# Patient Record
Sex: Male | Born: 1980 | Race: White | Hispanic: No | Smoking: Never smoker
Health system: Southern US, Community
[De-identification: ages and names within clinical notes are randomized; demographics above are authoritative.]

## PROBLEM LIST (undated history)

## (undated) DIAGNOSIS — M5126 Other intervertebral disc displacement, lumbar region: Secondary | ICD-10-CM

## (undated) DIAGNOSIS — M51369 Other intervertebral disc degeneration, lumbar region without mention of lumbar back pain or lower extremity pain: Secondary | ICD-10-CM

## (undated) DIAGNOSIS — E232 Diabetes insipidus: Secondary | ICD-10-CM

## (undated) DIAGNOSIS — M5136 Other intervertebral disc degeneration, lumbar region: Secondary | ICD-10-CM

## (undated) DIAGNOSIS — K5190 Ulcerative colitis, unspecified, without complications: Secondary | ICD-10-CM

## (undated) HISTORY — DX: Diabetes insipidus: E23.2

## (undated) HISTORY — DX: Ulcerative colitis, unspecified, without complications: K51.90

## (undated) HISTORY — DX: Other intervertebral disc displacement, lumbar region: M51.26

## (undated) HISTORY — DX: Other intervertebral disc degeneration, lumbar region without mention of lumbar back pain or lower extremity pain: M51.369

## (undated) HISTORY — PX: NO PAST SURGERIES: SHX2092

## (undated) HISTORY — DX: Other intervertebral disc degeneration, lumbar region: M51.36

---

## 2005-08-09 DIAGNOSIS — E232 Diabetes insipidus: Secondary | ICD-10-CM

## 2005-08-09 HISTORY — DX: Diabetes insipidus: E23.2

## 2005-10-12 ENCOUNTER — Encounter: Admission: RE | Admit: 2005-10-12 | Discharge: 2005-10-12 | Payer: Self-pay | Admitting: *Deleted

## 2006-01-27 ENCOUNTER — Encounter: Admission: RE | Admit: 2006-01-27 | Discharge: 2006-01-27 | Payer: Self-pay | Admitting: Neurology

## 2006-05-25 ENCOUNTER — Ambulatory Visit: Payer: Self-pay | Admitting: Physical Medicine & Rehabilitation

## 2006-05-25 ENCOUNTER — Encounter
Admission: RE | Admit: 2006-05-25 | Discharge: 2006-08-23 | Payer: Self-pay | Admitting: Physical Medicine & Rehabilitation

## 2006-07-22 ENCOUNTER — Ambulatory Visit: Payer: Self-pay | Admitting: Physical Medicine & Rehabilitation

## 2006-09-20 ENCOUNTER — Encounter
Admission: RE | Admit: 2006-09-20 | Discharge: 2006-12-19 | Payer: Self-pay | Admitting: Physical Medicine & Rehabilitation

## 2006-10-13 ENCOUNTER — Ambulatory Visit: Payer: Self-pay | Admitting: Physical Medicine & Rehabilitation

## 2007-02-03 IMAGING — RF DG FL GUIDE NDL PLMT  - NRPT [facility]
2 series · 2 of 2 positions shown · IV contrast (omniscan)
Comparison: none

CLINICAL DATA: Wrist pain. No previous surgery.

Right wrist injection under fluoroscopy for MRI:
TECHNIQUE: Under fluoroscopy, an appropriate skin entry site was identified.
Skin site was marked, prepped with Betadine, and draped in usual sterile
fashion, and infiltrated locally with 1% lidocaine.
A 25-gauge short needle was advanced into the radiocarpal compartment at the
level of the scaphoid. A mixture of 10 mL iodinated contrast with 0.1 mL of
Omniscan was utilized, administering approximately 3 cc into the radiocarpal
compartment under fluoroscopic visualization. Spot fluoroscopic images
demonstrate appropriate intra-articular contrast spread. Patient was transferred
to MRI for the scheduled scan. Patient tolerated the procedure well, with no
immediate complication.

[Series 1: arthrogram · 1 of 1 slices shown (1 of 2)]
[im 1/1]
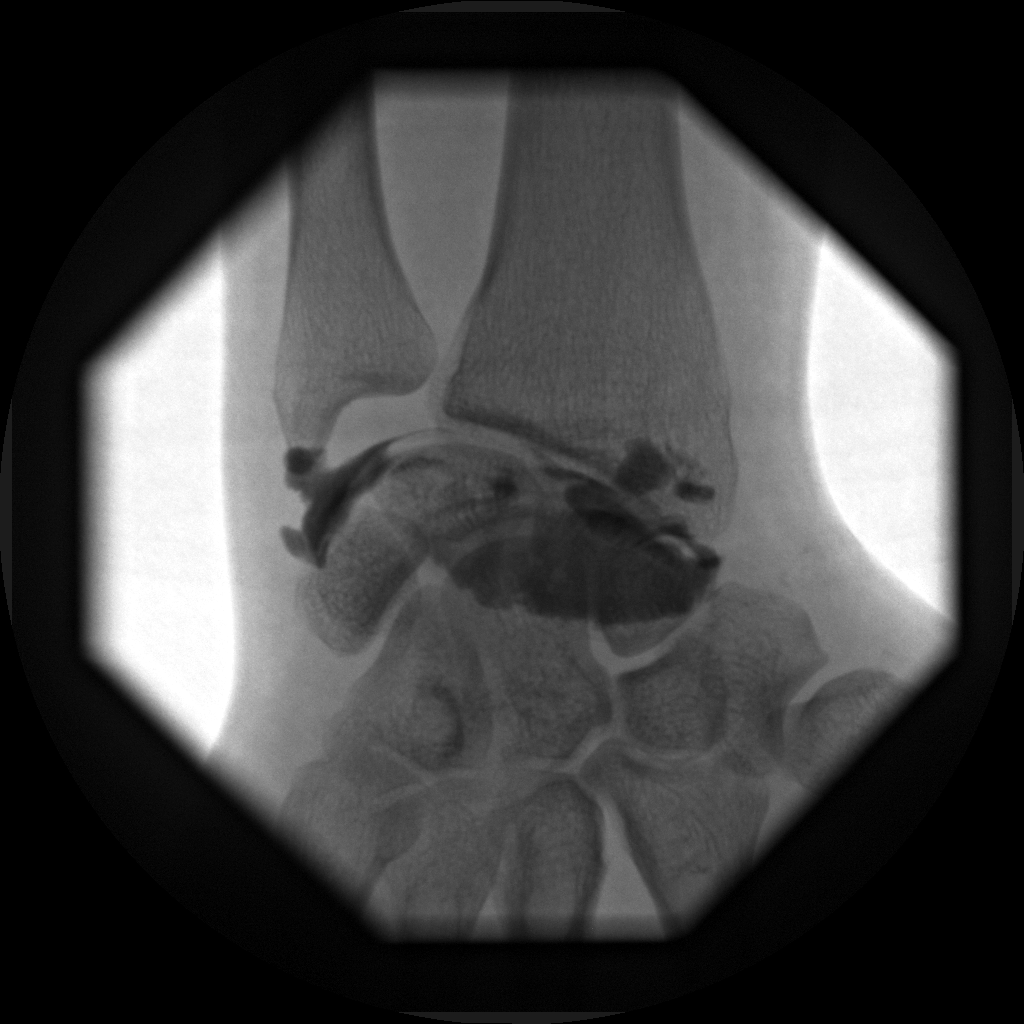

[Series 2: arthrogram · 1 of 1 slices shown (2 of 2)]
[im 1/1]
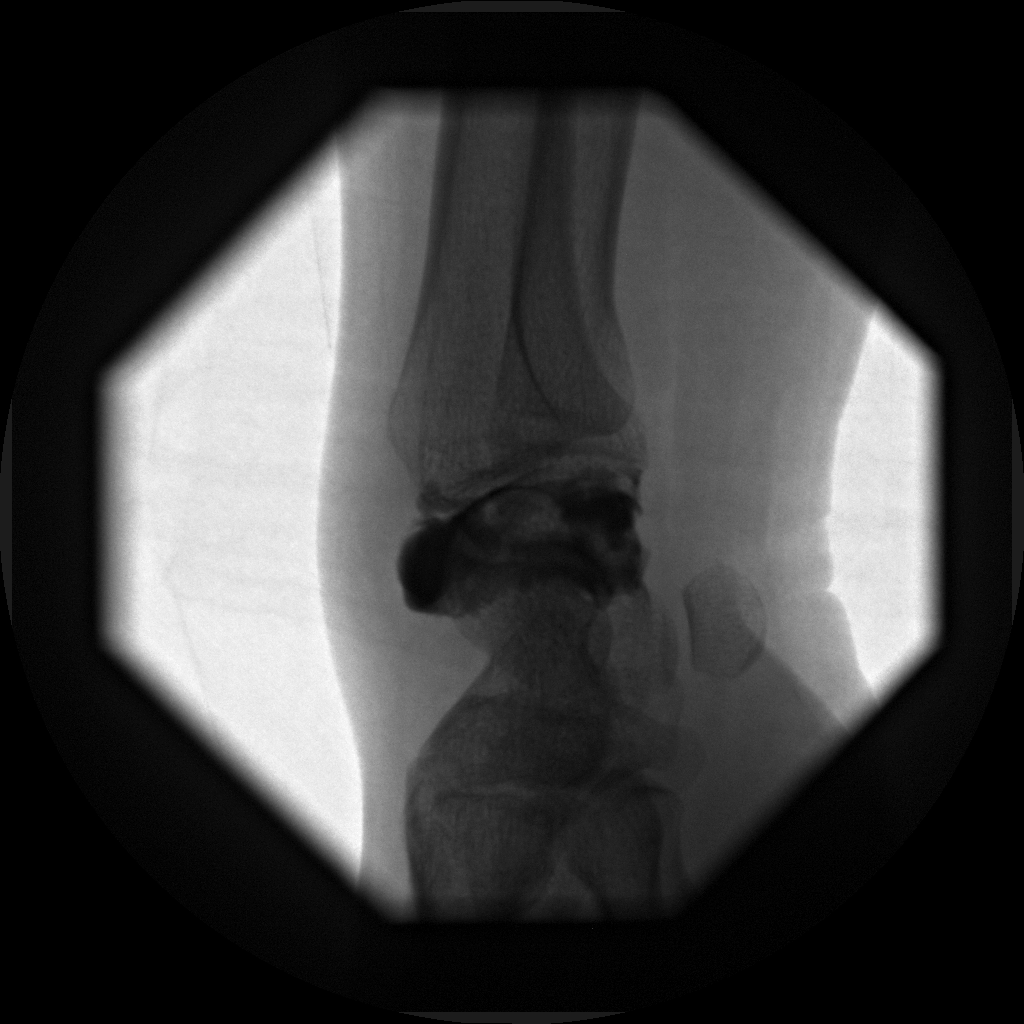

[2 of 2 positions shown; findings below may reference images not displayed]

IMPRESSION: 1. Technically successful right radiocarpal compartment injection for MRI.

## 2015-07-07 ENCOUNTER — Ambulatory Visit (INDEPENDENT_AMBULATORY_CARE_PROVIDER_SITE_OTHER): Payer: BLUE CROSS/BLUE SHIELD | Admitting: Neurology

## 2015-07-07 ENCOUNTER — Encounter: Payer: Self-pay | Admitting: Neurology

## 2015-07-07 VITALS — BP 130/83 | HR 73 | Ht 73.0 in | Wt 241.2 lb

## 2015-07-07 DIAGNOSIS — R51 Headache: Secondary | ICD-10-CM

## 2015-07-07 DIAGNOSIS — G5620 Lesion of ulnar nerve, unspecified upper limb: Secondary | ICD-10-CM

## 2015-07-07 DIAGNOSIS — R519 Headache, unspecified: Secondary | ICD-10-CM

## 2015-07-07 DIAGNOSIS — R0683 Snoring: Secondary | ICD-10-CM

## 2015-07-07 MED ORDER — GABAPENTIN 300 MG PO CAPS: 300.0000 mg | ORAL_CAPSULE | Freq: Three times a day (TID) | ORAL | Status: DC
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
## 2015-07-07 NOTE — Patient Instructions (Signed)
Overall you are doing fairly well but I do want to suggest a few things today:   Remember to drink plenty of fluid, eat healthy meals and do not skip any meals. Try to eat protein with a every meal and eat a healthy snack such as fruit or nuts in between meals. Try to keep a regular sleep-wake schedule and try to exercise daily, particularly in the form of walking, 20-30 minutes a day, if you can.   As far as your medications are concerned, I would like to suggest: neurontin  As far as diagnostic testing: emg/ncs  I would like to see you back for emg/ncs, sooner if we need to. Please call us with any interim questions, concerns, problems, updates or refill requests.   Our phone number is [phone]. We also have an after hours call service for urgent matters and there is a physician on-call for urgent questions. For any emergencies you know to call 911 or go to the nearest emergency room

## 2015-07-08 ENCOUNTER — Encounter: Payer: Self-pay | Admitting: Neurology

## 2015-07-08 ENCOUNTER — Ambulatory Visit (INDEPENDENT_AMBULATORY_CARE_PROVIDER_SITE_OTHER): Payer: BLUE CROSS/BLUE SHIELD | Admitting: Neurology

## 2015-07-08 VITALS — BP 130/98 | HR 80 | Resp 20 | Ht 73.0 in | Wt 243.0 lb

## 2015-07-08 DIAGNOSIS — R0683 Snoring: Secondary | ICD-10-CM | POA: Diagnosis not present

## 2015-07-08 DIAGNOSIS — E232 Diabetes insipidus: Secondary | ICD-10-CM
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
## 2015-07-18 ENCOUNTER — Telehealth: Payer: Self-pay | Admitting: Neurology

## 2015-07-18 NOTE — Telephone Encounter (Signed)
Returned call from patient who called complaining of about a new side effect on gabapentin. He has been taking it for one week which has helped his neuropathic pain but he has noticed importence. I explained to him that this is not a very common side effect. Patient wants to wait and keep his scheduled appointment with Dr. Lucia GaskinsAhern in one week and will discuss this further with her

## 2015-07-22 ENCOUNTER — Ambulatory Visit (INDEPENDENT_AMBULATORY_CARE_PROVIDER_SITE_OTHER): Payer: BLUE CROSS/BLUE SHIELD | Admitting: Neurology

## 2015-07-22 ENCOUNTER — Encounter (INDEPENDENT_AMBULATORY_CARE_PROVIDER_SITE_OTHER): Payer: Self-pay

## 2015-07-22 DIAGNOSIS — G5622 Lesion of ulnar nerve, left upper limb: Secondary | ICD-10-CM | POA: Diagnosis not present

## 2015-07-22 DIAGNOSIS — G5621 Lesion of ulnar nerve, right upper limb: Secondary | ICD-10-CM

## 2015-07-22 DIAGNOSIS — Z0289 Encounter for other administrative examinations: Secondary | ICD-10-CM

## 2015-07-22 DIAGNOSIS — G5620 Lesion of ulnar nerve, unspecified upper limb: Secondary | ICD-10-CM
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
## 2015-07-28 ENCOUNTER — Institutional Professional Consult (permissible substitution): Payer: BLUE CROSS/BLUE SHIELD | Admitting: Neurology

## 2015-08-04 ENCOUNTER — Ambulatory Visit (INDEPENDENT_AMBULATORY_CARE_PROVIDER_SITE_OTHER): Payer: BLUE CROSS/BLUE SHIELD | Admitting: Neurology

## 2015-08-04 DIAGNOSIS — R0683 Snoring: Secondary | ICD-10-CM

## 2015-08-04 DIAGNOSIS — G4710 Hypersomnia, unspecified: Secondary | ICD-10-CM

## 2015-08-04 DIAGNOSIS — E232 Diabetes insipidus: Secondary | ICD-10-CM

## 2015-08-05 NOTE — Sleep Study (Signed)
Please see the scanned sleep study interpretation located in the procedure tab in the chart view section.  

## 2015-08-06 ENCOUNTER — Telehealth: Payer: Self-pay

## 2015-08-06 NOTE — Telephone Encounter (Signed)
Spoke to pt and advised him that his sleep study revealed osa and treatment is advised. PAP therapy is indicated and Dr. Vickey Hugerohmeier recommended proceeding with a titration study to optimize therapy. I also advised pt that alternative therapies include an oral appliance or ENT evaluation/procedure and that weight loss may be entertained. Pt states that he does not wish to proceed with anything at this time. He really does not want to start cpap. He wants to lose 25 lbs and see if this helps. He would be interested in perhaps an ENT referral, but he wants to think about this first. I offered a follow up appt with Dr. Vickey Hugerohmeier but the pt declined, saying he thinks he just will lose the weight. He will call us back if he changes his mind about the appt. Pt verbalized understanding.

## 2015-09-19 ENCOUNTER — Other Ambulatory Visit: Payer: Self-pay | Admitting: Neurosurgery

## 2015-09-19 DIAGNOSIS — M5023 Other cervical disc displacement, cervicothoracic region: Secondary | ICD-10-CM

## 2015-10-01 ENCOUNTER — Ambulatory Visit
Admission: RE | Admit: 2015-10-01 | Discharge: 2015-10-01 | Disposition: A | Discharge status: Home / Self Care | Payer: Self-pay | Source: Ambulatory Visit | Attending: Neurosurgery | Admitting: Neurosurgery

## 2015-10-01 ENCOUNTER — Ambulatory Visit
Admission: RE | Admit: 2015-10-01 | Discharge: 2015-10-01 | Disposition: A | Discharge status: Home / Self Care | Payer: BLUE CROSS/BLUE SHIELD | Source: Ambulatory Visit | Attending: Neurosurgery | Admitting: Neurosurgery

## 2015-10-01 VITALS — BP 132/75 | HR 79

## 2015-10-01 DIAGNOSIS — M5023 Other cervical disc displacement, cervicothoracic region: Secondary | ICD-10-CM

## 2015-10-01 DIAGNOSIS — R51 Headache: Principal | ICD-10-CM

## 2015-10-01 DIAGNOSIS — R519 Headache, unspecified: Secondary | ICD-10-CM

## 2015-10-01 MED ORDER — ONDANSETRON HCL 4 MG/2ML IJ SOLN
4.0000 mg | Freq: Once | INTRAMUSCULAR | Status: AC
  Administered 2015-10-01: 4 mg via INTRAMUSCULAR

## 2015-10-01 MED ORDER — MEPERIDINE HCL 100 MG/ML IJ SOLN
75.0000 mg | Freq: Once | INTRAMUSCULAR | Status: AC
  Administered 2015-10-01: 75 mg via INTRAMUSCULAR

## 2015-10-01 MED ORDER — DIAZEPAM 5 MG PO TABS
10.0000 mg | ORAL_TABLET | Freq: Once | ORAL | Status: AC
  Administered 2015-10-01: 10 mg via ORAL

## 2015-10-01 MED ORDER — IOHEXOL 300 MG/ML  SOLN
10.0000 mL | Freq: Once | INTRAMUSCULAR | Status: AC | PRN
  Administered 2015-10-01: 10 mL via INTRATHECAL

## 2015-10-01 NOTE — Progress Notes (Signed)
Pt states he has been off Tramadol for some time.  Discharge instructions explained to pt.

## 2015-10-01 NOTE — Discharge Instructions (Signed)
Myelogram Discharge Instructions  1. Go home and rest quietly for the next 24 hours.  It is important to lie flat for the next 24 hours.  Get up only to go to the restroom.  You may lie in the bed or on a couch on your back, your stomach, your left side or your right side.  You may have one pillow under your head.  You may have pillows between your knees while you are on your side or under your knees while you are on your back.  2. DO NOT drive today.  Recline the seat as far back as it will go, while still wearing your seat belt, on the way home.  3. You may get up to go to the bathroom as needed.  You may sit up for 10 minutes to eat.  You may resume your normal diet and medications unless otherwise indicated.  Drink lots of extra fluids today and tomorrow.  4. The incidence of headache, nausea, or vomiting is about 5% (one in 20 patients).  If you develop a headache, lie flat and drink plenty of fluids until the headache goes away.  Caffeinated beverages may be helpful.  If you develop severe nausea and vomiting or a headache that does not go away with flat bed rest, call [phone].  5. You may resume normal activities after your 24 hours of bed rest is over; however, do not exert yourself strongly or do any heavy lifting tomorrow. If when you get up you have a headache when standing, go back to bed and force fluids for another 24 hours.  6. Call your physician for a follow-up appointment.  The results of your myelogram will be sent directly to your physician by the following day.  7. If you have any questions or if complications develop after you arrive home, please call [phone].  Discharge instructions have been explained to the patient.  The patient, or the person responsible for the patient, fully understands these instructions.       May resume Tramadol on Feb. 23, 2017, after 9:30 am

## 2015-10-02 ENCOUNTER — Other Ambulatory Visit: Payer: Self-pay

## 2015-10-06 ENCOUNTER — Ambulatory Visit
Admission: RE | Admit: 2015-10-06 | Discharge: 2015-10-06 | Disposition: A | Discharge status: Home / Self Care | Payer: BLUE CROSS/BLUE SHIELD | Source: Ambulatory Visit | Attending: Neurosurgery | Admitting: Neurosurgery

## 2015-10-06 ENCOUNTER — Other Ambulatory Visit: Payer: Self-pay | Admitting: Neurosurgery

## 2015-10-06 DIAGNOSIS — G971 Other reaction to spinal and lumbar puncture: Secondary | ICD-10-CM

## 2015-10-06 MED ORDER — IOHEXOL 180 MG/ML  SOLN
1.0000 mL | Freq: Once | INTRAMUSCULAR | Status: AC | PRN
  Administered 2015-10-06: 1 mL via EPIDURAL

## 2015-10-06 NOTE — Discharge Instructions (Signed)

## 2015-10-08 ENCOUNTER — Other Ambulatory Visit: Payer: Self-pay

## 2015-10-16 ENCOUNTER — Ambulatory Visit (INDEPENDENT_AMBULATORY_CARE_PROVIDER_SITE_OTHER): Payer: BLUE CROSS/BLUE SHIELD | Admitting: Neurology

## 2015-10-16 VITALS — BP 139/87 | HR 102 | Ht 73.0 in | Wt 240.8 lb

## 2015-10-16 DIAGNOSIS — G5623 Lesion of ulnar nerve, bilateral upper limbs: Secondary | ICD-10-CM | POA: Diagnosis not present

## 2015-10-16 DIAGNOSIS — G5620 Lesion of ulnar nerve, unspecified upper limb: Secondary | ICD-10-CM

## 2015-10-16 DIAGNOSIS — G4733 Obstructive sleep apnea (adult) (pediatric): Secondary | ICD-10-CM
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
## 2015-10-16 NOTE — Patient Instructions (Signed)

## 2015-10-17 ENCOUNTER — Other Ambulatory Visit: Payer: Self-pay | Admitting: *Deleted

## 2015-10-17 ENCOUNTER — Telehealth: Payer: Self-pay | Admitting: *Deleted

## 2015-10-17 MED ORDER — GABAPENTIN 300 MG PO CAPS: 300.0000 mg | ORAL_CAPSULE | Freq: Three times a day (TID) | ORAL | Status: DC

## 2015-10-17 NOTE — Telephone Encounter (Signed)
[facility] She cx rx gabapentin. [facility]vised [facility] so [facility] can receive 90 day supply. She verbalized understanding.

## 2015-10-19 DIAGNOSIS — G5620 Lesion of ulnar nerve, unspecified upper limb: Secondary | ICD-10-CM | POA: Insufficient documentation

## 2015-10-19 DIAGNOSIS — G4733 Obstructive sleep apnea (adult) (pediatric): Secondary | ICD-10-CM | POA: Insufficient documentation

## 2015-10-30 ENCOUNTER — Other Ambulatory Visit: Payer: Self-pay

## 2016-12-14 ENCOUNTER — Other Ambulatory Visit: Payer: Self-pay | Admitting: Neurology
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
## 2017-04-19 ENCOUNTER — Telehealth: Payer: Self-pay | Admitting: Neurology

## 2017-04-19 NOTE — Telephone Encounter (Signed)
Pt is [place] for [place] please call

## 2017-04-19 NOTE — Telephone Encounter (Signed)
Called the patient back. No answer. Left a voicemail stating that Dr Dohmeier hasn't seen the patient since 2016. She states that he should have his PCP make the referral to a neurologist.  She did research and find a MD, Dr Laural BenesSudha Tallavajula at [facility] Their number is [phone]. LVM that pt could call back if he had further questions
